# Patient Record
Sex: Female | Born: 1995 | Race: White | Hispanic: No | Marital: Single | Smoking: Never smoker
Health system: Southern US, Community
[De-identification: ages and names within clinical notes are randomized; demographics above are authoritative.]

## PROBLEM LIST (undated history)

## (undated) DIAGNOSIS — Z789 Other specified health status: Secondary | ICD-10-CM

## (undated) HISTORY — DX: Other specified health status: Z78.9

## (undated) HISTORY — PX: NO PAST SURGERIES: SHX2092

---

## 2020-09-23 ENCOUNTER — Ambulatory Visit (INDEPENDENT_AMBULATORY_CARE_PROVIDER_SITE_OTHER): Payer: 59 | Admitting: Medical-Surgical

## 2020-09-23 ENCOUNTER — Other Ambulatory Visit: Payer: Self-pay

## 2020-09-23 ENCOUNTER — Encounter: Payer: Self-pay | Admitting: Medical-Surgical

## 2020-09-23 VITALS — BP 148/83 | HR 100 | Temp 99.2°F | Ht 66.0 in | Wt 160.3 lb

## 2020-09-23 DIAGNOSIS — Z0000 Encounter for general adult medical examination without abnormal findings: Secondary | ICD-10-CM

## 2020-09-23 DIAGNOSIS — Z7689 Persons encountering health services in other specified circumstances: Secondary | ICD-10-CM

## 2020-09-23 DIAGNOSIS — Z020 Encounter for examination for admission to educational institution: Secondary | ICD-10-CM | POA: Diagnosis not present

## 2020-09-23 DIAGNOSIS — Z1159 Encounter for screening for other viral diseases: Secondary | ICD-10-CM

## 2020-09-23 DIAGNOSIS — Z114 Encounter for screening for human immunodeficiency virus [HIV]: Secondary | ICD-10-CM

## 2020-09-23 NOTE — Progress Notes (Signed)
New Patient Office Visit  Subjective:  Patient ID: Vanessa Hill, female    DOB: July 25, 1995  Age: 25 y.o. MRN: [id number]  CC:  Chief Complaint  Patient presents with  . Establish Care    HPI Vanessa Hill presents to establish care.   Dental care-up-to-date, no current concerns Eye exam-up-to-date, wears glasses Exercise-regularly does cardiovascular exercise as well as weight training Diet-no special diets, eats well-balanced but does occasionally enjoy fast food Pap smear-has never had 1, currently on her menstrual cycle so we will not do this today COVID vaccines-done  Past Medical History:  Diagnosis Date  . No pertinent past medical history     Past Surgical History:  Procedure Laterality Date  . NO PAST SURGERIES      Family History  Problem Relation Age of Onset  . Pancreatic cancer Maternal Grandmother     Social History   Socioeconomic History  . Marital status: Single    Spouse name: Not on file  . Number of children: Not on file  . Years of education: Not on file  . Highest education level: Not on file  Occupational History  . Not on file  Tobacco Use  . Smoking status: Never Smoker  . Smokeless tobacco: Never Used  Vaping Use  . Vaping Use: Never used  Substance and Sexual Activity  . Alcohol use: Yes    Comment: Rarely  . Drug use: Never  . Sexual activity: Not Currently    Birth control/protection: Condom  Other Topics Concern  . Not on file  Social History Narrative  . Not on file   Social Determinants of Health   Financial Resource Strain: Not on file  Food Insecurity: Not on file  Transportation Needs: Not on file  Physical Activity: Not on file  Stress: Not on file  Social Connections: Not on file  Intimate Partner Violence: Not on file    ROS Review of Systems  Constitutional: Negative for chills, fatigue, fever and unexpected weight change.  HENT: Negative for congestion, rhinorrhea, sinus pressure and sore  throat.   Respiratory: Negative for cough, chest tightness and shortness of breath.   Cardiovascular: Negative for chest pain, palpitations and leg swelling.  Gastrointestinal: Negative for abdominal pain, constipation, diarrhea, nausea and vomiting.  Endocrine: Negative for cold intolerance and heat intolerance.  Genitourinary: Negative for dysuria, frequency, urgency and vaginal discharge.  Skin: Negative for rash and wound.  Neurological: Negative for dizziness, light-headedness and headaches.  Hematological: Does not bruise/bleed easily.  Psychiatric/Behavioral: Negative for self-injury, sleep disturbance and suicidal ideas. The patient is not nervous/anxious.     Objective:   Today's Vitals: BP (!) 148/83   Pulse 100   Temp 99.2 F (37.3 C)   Ht '5\' 6"'  (1.676 m)   Wt 160 lb 4.8 oz (72.7 kg)   LMP 09/21/2020   SpO2 100%   BMI 25.87 kg/m   Physical Exam Constitutional:      General: She is not in acute distress.    Appearance: Normal appearance. She is not ill-appearing.  HENT:     Head: Normocephalic and atraumatic.     Right Ear: Tympanic membrane, ear canal and external ear normal. There is no impacted cerumen.     Left Ear: Tympanic membrane, ear canal and external ear normal. There is no impacted cerumen.  Eyes:     General:        Right eye: No discharge.        Left eye: No  discharge.     Extraocular Movements: Extraocular movements intact.     Conjunctiva/sclera: Conjunctivae normal.     Pupils: Pupils are equal, round, and reactive to light.  Neck:     Thyroid: No thyromegaly.     Vascular: No carotid bruit or JVD.     Trachea: Trachea normal.  Cardiovascular:     Rate and Rhythm: Normal rate and regular rhythm.     Pulses: Normal pulses.     Heart sounds: Normal heart sounds. No murmur heard. No friction rub. No gallop.   Pulmonary:     Effort: Pulmonary effort is normal. No respiratory distress.     Breath sounds: Normal breath sounds. No wheezing.   Abdominal:     General: Bowel sounds are normal. There is no distension.     Palpations: Abdomen is soft.     Tenderness: There is no abdominal tenderness. There is no guarding.  Musculoskeletal:        General: Normal range of motion.     Cervical back: Normal range of motion and neck supple.  Skin:    General: Skin is warm and dry.  Neurological:     Mental Status: She is alert and oriented to person, place, and time.     Cranial Nerves: No cranial nerve deficit.  Psychiatric:        Mood and Affect: Mood normal.        Behavior: Behavior normal.        Thought Content: Thought content normal.        Judgment: Judgment normal.     Assessment & Plan:   1. Encounter to establish care Reviewed available information and discussed healthcare concerns with patient.  2. School physical exam School exam forms provided by patient.  Screenings completed.  Labs ordered for hepatitis B, MMR, and varicella titers.  Obtaining an up-to-date QuantiFERON gold today. - Hepatitis B surface antibody,quantitative - QuantiFERON-TB Gold Plus - Measles/Mumps/Rubella Immunity - Varicella-Zoster Virus Antibody (Immunity Screen), ACIF ,Serum  3. Preventative health care Checking CBC with differential, CMP, and lipid panel today. - CBC with Differential/Platelet - COMPLETE METABOLIC PANEL WITH GFR - Lipid panel  No outpatient encounter medications on file as of 09/23/2020.   No facility-administered encounter medications on file as of 09/23/2020.    Follow-up: Return in about 1 year (around 09/23/2021) for annual physical exam or sooner if needed.   Clearnce Sorrel, DNP, APRN, FNP-BC [facility]

## 2020-09-24 ENCOUNTER — Encounter: Payer: 59 | Admitting: Medical-Surgical

## 2020-09-24 DIAGNOSIS — Z0000 Encounter for general adult medical examination without abnormal findings: Secondary | ICD-10-CM

## 2020-09-24 DIAGNOSIS — Z114 Encounter for screening for human immunodeficiency virus [HIV]: Secondary | ICD-10-CM

## 2020-09-24 DIAGNOSIS — Z1159 Encounter for screening for other viral diseases: Secondary | ICD-10-CM

## 2020-09-26 LAB — CBC WITH DIFFERENTIAL/PLATELET
Absolute Monocytes: 370 cells/uL (ref 200–950)
Basophils Absolute: 53 cells/uL (ref 0–200)
Basophils Relative: 0.8 %
Eosinophils Absolute: 119 cells/uL (ref 15–500)
Eosinophils Relative: 1.8 %
HCT: 42.4 % (ref 35.0–45.0)
Hemoglobin: 14.1 g/dL (ref 11.7–15.5)
Lymphs Abs: 1967 cells/uL (ref 850–3900)
MCH: 28.5 pg (ref 27.0–33.0)
MCHC: 33.3 g/dL (ref 32.0–36.0)
MCV: 85.7 fL (ref 80.0–100.0)
MPV: 9.4 fL (ref 7.5–12.5)
Monocytes Relative: 5.6 %
Neutro Abs: 4092 cells/uL (ref 1500–7800)
Neutrophils Relative %: 62 %
Platelets: 434 10*3/uL — ABNORMAL HIGH (ref 140–400)
RBC: 4.95 10*6/uL (ref 3.80–5.10)
RDW: 12.2 % (ref 11.0–15.0)
Total Lymphocyte: 29.8 %
WBC: 6.6 10*3/uL (ref 3.8–10.8)

## 2020-09-26 LAB — COMPLETE METABOLIC PANEL WITH GFR
AG Ratio: 1.9 (calc) (ref 1.0–2.5)
ALT: 9 U/L (ref 6–29)
AST: 13 U/L (ref 10–30)
Albumin: 5.1 g/dL (ref 3.6–5.1)
Alkaline phosphatase (APISO): 64 U/L (ref 31–125)
BUN/Creatinine Ratio: 7 (calc) (ref 6–22)
BUN: 6 mg/dL — ABNORMAL LOW (ref 7–25)
CO2: 25 mmol/L (ref 20–32)
Calcium: 9.8 mg/dL (ref 8.6–10.2)
Chloride: 104 mmol/L (ref 98–110)
Creat: 0.87 mg/dL (ref 0.50–1.10)
GFR, Est African American: 109 mL/min/{1.73_m2} (ref >=60)
GFR, Est Non African American: 94 mL/min/{1.73_m2} (ref >=60)
Globulin: 2.7 g/dL (calc) (ref 1.9–3.7)
Glucose, Bld: 79 mg/dL (ref 65–99)
Potassium: 4.7 mmol/L (ref 3.5–5.3)
Sodium: 138 mmol/L (ref 135–146)
Total Bilirubin: 0.3 mg/dL (ref 0.2–1.2)
Total Protein: 7.8 g/dL (ref 6.1–8.1)

## 2020-09-26 LAB — VARICELLA-ZOSTER VIRUS AB(IMMUNITY SCREEN),ACIF,SERUM: VARICELLA ZOSTER VIRUS AB (IMMUNITY SCR),ACIF SERUM: >=1:4 {titer}

## 2020-09-26 LAB — QUANTIFERON-TB GOLD PLUS
Mitogen-NIL: 8.5 IU/mL
NIL: 0.02 IU/mL
QuantiFERON-TB Gold Plus: NEGATIVE
TB1-NIL: 0 IU/mL
TB2-NIL: 0 IU/mL

## 2020-09-26 LAB — MEASLES/MUMPS/RUBELLA IMMUNITY
Mumps IgG: <9 AU/mL — ABNORMAL LOW
Rubella: 1.98 Index
Rubeola IgG: 55.2 AU/mL

## 2020-09-26 LAB — LIPID PANEL
Cholesterol: 183 mg/dL (ref <200)
HDL: 42 mg/dL — ABNORMAL LOW (ref >=50)
LDL Cholesterol (Calc): 120 mg/dL (calc) — ABNORMAL HIGH
Non-HDL Cholesterol (Calc): 141 mg/dL (calc) — ABNORMAL HIGH (ref <130)
Total CHOL/HDL Ratio: 4.4 (calc) (ref <5.0)
Triglycerides: 103 mg/dL (ref <150)

## 2020-09-26 LAB — HEPATITIS B SURFACE ANTIBODY, QUANTITATIVE: Hep B S AB Quant (Post): 6 m[IU]/mL — ABNORMAL LOW (ref >=10)

## 2020-09-29 ENCOUNTER — Encounter: Payer: Self-pay | Admitting: Medical-Surgical

## 2020-09-29 ENCOUNTER — Telehealth: Payer: Self-pay | Admitting: Medical-Surgical

## 2020-09-29 NOTE — Telephone Encounter (Signed)
Made 2 attempts to contact the patient. No answer. VM full.

## 2020-09-29 NOTE — Telephone Encounter (Signed)
Forms for her school physical have been placed in the file folder at the front for [facility] to pick up. They have not been faxed because the fax number is not on the forms. Please let her know she can come by at her convenience to get these. She also should schedule a nurse visit to get a booster of her Hepatitis B and MMR vaccines.   Clearnce Sorrel, DNP, APRN, FNP-BC [facility]

## 2020-10-02 ENCOUNTER — Encounter: Payer: Self-pay | Admitting: Medical-Surgical

## 2020-10-02 ENCOUNTER — Other Ambulatory Visit: Payer: Self-pay

## 2020-10-02 ENCOUNTER — Ambulatory Visit (INDEPENDENT_AMBULATORY_CARE_PROVIDER_SITE_OTHER): Payer: 59 | Admitting: Medical-Surgical

## 2020-10-02 VITALS — BP 114/74 | HR 79 | Temp 98.6°F | Ht 66.0 in | Wt 159.4 lb

## 2020-10-02 DIAGNOSIS — Z23 Encounter for immunization: Secondary | ICD-10-CM

## 2020-10-02 NOTE — Patient Instructions (Signed)
MMR Vaccine (Measles, Mumps, and Rubella): What You Need to Know 1. Why get vaccinated? MMR vaccine can prevent measles, mumps, and rubella.  MEASLES (M) causes fever, cough, runny nose, and red, watery eyes, commonly followed by a rash that covers the whole body. It can lead to seizures (often associated with fever), ear infections, diarrhea, and pneumonia. Rarely, measles can cause brain damage or death.  MUMPS (M) causes fever, headache, muscle aches, tiredness, loss of appetite, and swollen and tender salivary glands under the ears. It can lead to deafness, swelling of the brain and/or spinal cord covering, painful swelling of the testicles or ovaries, and, very rarely, death.  RUBELLA (R) causes fever, sore throat, rash, headache, and eye irritation. It can cause arthritis in up to half of teenage and adult women. If a person gets rubella while they are pregnant, they could have a miscarriage or the baby could be born with serious birth defects. Most people who are vaccinated with MMR will be protected for life. Vaccines and high rates of vaccination have made these diseases much less common in the [place]. 2. MMR vaccine Children need 2 doses of MMR vaccine, usually:  First dose at age 85 through 36 months  Second dose at age 67 through 70 years Infants who will be traveling outside the [place] when they are between 18 and 8 months of age should get a dose of MMR vaccine before travel. These children should still get 2 additional doses at the recommended ages for long-lasting protection. Older children, adolescents, and adults also need 1 or 2 doses of MMR vaccine if they are not already immune to measles, mumps, and rubella. Your health care provider can help you determine how many doses you need. A third dose of MMR might be recommended for certain people in mumps outbreak situations. MMR vaccine may be given at the same time as other vaccines. Children 12 months through 44 years  of age might receive MMR vaccine together with varicella vaccine in a single shot, known as MMRV. Your health care provider can give you more information. 3. Talk with your health care provider Tell your vaccination provider if the person getting the vaccine:  Has had an allergic reaction after a previous dose of MMR or MMRV vaccine, or has any severe, life-threatening allergies  Is pregnant or thinks they might be pregnant--pregnant people should not get MMR vaccine  Has a weakened immune system, or has a parent, brother, or sister with a history of hereditary or congenital immune system problems  Has ever had a condition that makes him or her bruise or bleed easily  Has recently had a blood transfusion or received other blood products  Has tuberculosis  Has gotten any other vaccines in the past 4 weeks In some cases, your health care provider may decide to postpone MMR vaccination until a future visit. People with minor illnesses, such as a cold, may be vaccinated. People who are moderately or severely ill should usually wait until they recover before getting MMR vaccine. Your health care provider can give you more information. 4. Risks of a vaccine reaction  Sore arm from the injection or redness where the shot is given, fever, and a mild rash can happen after MMR vaccination.  Swelling of the glands in the cheeks or neck or temporary pain and stiffness in the joints (mostly in teenage or adult women) sometimes occur after MMR vaccination.  More serious reactions happen rarely. These can include seizures (often  associated with fever) or temporary low platelet count that can cause unusual bleeding or bruising.  In people with serious immune system problems, this vaccine may cause an infection which may be life-threatening. People with serious immune system problems should not get MMR vaccine. People sometimes faint after medical procedures, including vaccination. Tell your provider if  you feel dizzy or have vision changes or ringing in the ears. As with any medicine, there is a very remote chance of a vaccine causing a severe allergic reaction, other serious injury, or death. 5. What if there is a serious problem? An allergic reaction could occur after the vaccinated person leaves the clinic. If you see signs of a severe allergic reaction (hives, swelling of the face and throat, difficulty breathing, a fast heartbeat, dizziness, or weakness), call 9-1-1 and get the person to the nearest hospital. For other signs that concern you, call your health care provider. Adverse reactions should be reported to the Vaccine Adverse Event Reporting System (VAERS). Your health care provider will usually file this report, or you can do it yourself. Visit the VAERS website at www.vaers.SamedayNews.es or call 4371280182. VAERS is only for reporting reactions, and VAERS staff members do not give medical advice. 6. The National Vaccine Injury Compensation Program The [employer] Vaccine Injury Compensation Program (VICP) is a federal program that was created to compensate people who may have been injured by certain vaccines. Claims regarding alleged injury or death due to vaccination have a time limit for filing, which may be as short as two years. Visit the VICP website at GoldCloset.com.ee or call (863) 666-0742 to learn about the program and about filing a claim. 7. How can I learn more?  Ask your health care provider.  Call your local or state health department.  Visit the website of the Food and Drug Administration (FDA) for vaccine package inserts and additional information at TraderRating.uy.  Contact the Centers for Disease Control and Prevention (CDC): ? Call 343-460-8848 (1-800-CDC-INFO) or ? Visit CDC's website at http://hunter.com/. Vaccine Information Statement MMR Vaccine (12/27/2019) This information is not intended to replace advice given  to you by your health care provider. Make sure you discuss any questions you have with your health care provider. Document Revised: 02/13/2020 Document Reviewed: 02/13/2020 Elsevier Patient Education  [place]. Hepatitis B Vaccine: What You Need to Know 1. Why get vaccinated? Hepatitis B vaccine can prevent hepatitis B. Hepatitis B is a liver disease that can cause mild illness lasting a few weeks, or it can lead to a serious, lifelong illness.  Acute hepatitis B infection is a short-term illness that can lead to fever, fatigue, loss of appetite, nausea, vomiting, jaundice (yellow skin or eyes, dark urine, clay-colored bowel movements), and pain in the muscles, joints, and stomach.  Chronic hepatitis B infection is a long-term illness that occurs when the hepatitis B virus remains in a person's body. Most people who go on to develop chronic hepatitis B do not have symptoms, but it is still very serious and can lead to liver damage (cirrhosis), liver cancer, and death. Chronically infected people can spread hepatitis B virus to others, even if they do not feel or look sick themselves. Hepatitis B is spread when blood, semen, or other body fluid infected with the hepatitis B virus enters the body of a person who is not infected. People can become infected through:  Birth (if a pregnant person has hepatitis B, their baby can become infected)  Sharing items such as razors or toothbrushes  with an infected person  Contact with the blood or open sores of an infected person  Sex with an infected partner  Sharing needles, syringes, or other drug-injection equipment  Exposure to blood from needlesticks or other sharp instruments Most people who are vaccinated with hepatitis B vaccine are immune for life. 2. Hepatitis B vaccine Hepatitis B vaccine is usually given as 2, 3, or 4 shots. Infants should get their first dose of hepatitis B vaccine at birth and will usually complete the series at  3-69 months of age. The birth dose of hepatitis B vaccine is an important part of preventing long-term illness in infants and the spread of hepatitis B in the [place]. Children and adolescents younger than 70 years of age who have not yet gotten the vaccine should be vaccinated. Adults who were not vaccinated previously and want to be protected against hepatitis B can also get the vaccine. Hepatitis B vaccine is also recommended for the following people:  People whose sex partners have hepatitis B  Sexually active persons who are not in a long-term, monogamous relationship  People seeking evaluation or treatment for a sexually transmitted disease  Victims of sexual assault or abuse  Men who have sexual contact with other men  People who share needles, syringes, or other drug-injection equipment  People who live with someone infected with the hepatitis B virus  Health care and public safety workers at risk for exposure to blood or body fluids  Residents and staff of facilities for developmentally disabled people  People living in jail or prison  Travelers to regions with increased rates of hepatitis B  People with chronic liver disease, kidney disease on dialysis, HIV infection, infection with hepatitis C, or diabetes Hepatitis B vaccine may be given as a stand-alone vaccine, or as part of a combination vaccine (a type of vaccine that combines more than one vaccine together into one shot). Hepatitis B vaccine may be given at the same time as other vaccines. 3. Talk with your health care provider Tell your vaccination provider if the person getting the vaccine:  Has had an allergic reaction after a previous dose of hepatitis B vaccine, or has any severe, life-threatening allergies In some cases, your health care provider may decide to postpone hepatitis B vaccination until a future visit. Pregnant or breastfeeding people should be vaccinated if they are at risk for getting  hepatitis B. Pregnancy or breastfeeding are not reasons to avoid hepatitis B vaccination. People with minor illnesses, such as a cold, may be vaccinated. People who are moderately or severely ill should usually wait until they recover before getting hepatitis B vaccine. Your health care provider can give you more information. 4. Risks of a vaccine reaction  Soreness where the shot is given or fever can happen after hepatitis B vaccination. People sometimes faint after medical procedures, including vaccination. Tell your provider if you feel dizzy or have vision changes or ringing in the ears. As with any medicine, there is a very remote chance of a vaccine causing a severe allergic reaction, other serious injury, or death. 5. What if there is a serious problem? An allergic reaction could occur after the vaccinated person leaves the clinic. If you see signs of a severe allergic reaction (hives, swelling of the face and throat, difficulty breathing, a fast heartbeat, dizziness, or weakness), call 9-1-1 and get the person to the nearest hospital. For other signs that concern you, call your health care provider. Adverse reactions should be  reported to the Vaccine Adverse Event Reporting System (VAERS). Your health care provider will usually file this report, or you can do it yourself. Visit the VAERS website at www.vaers.SamedayNews.es or call 763-516-9068.VAERS is only for reporting reactions, and VAERS staff members do not give medical advice. 6. The National Vaccine Injury Compensation Program The [employer] Vaccine Injury Compensation Program (VICP) is a federal program that was created to compensate people who may have been injured by certain vaccines. Claims regarding alleged injury or death due to vaccination have a time limit for filing, which may be as short as two years. Visit the VICP website at GoldCloset.com.ee or call 3377356607 to learn about the program and about filing a  claim. 7. How can I learn more?  Ask your health care provider.  Call your local or state health department.  Visit the website of the Food and Drug Administration (FDA) for vaccine package inserts and additional information at TraderRating.uy.  Contact the Centers for Disease Control and Prevention (CDC): ? Call 850-043-6664 (1-800-CDC-INFO) or ? Visit CDC's website at http://hunter.com/. Vaccine Information Statement Hepatitis B Vaccine (03/06/2020) This information is not intended to replace advice given to you by your health care provider. Make sure you discuss any questions you have with your health care provider. Document Revised: 03/27/2020 Document Reviewed: 03/27/2020 Elsevier Patient Education  2021 [employer].

## 2020-10-02 NOTE — Progress Notes (Signed)
  Subjective:    CC: vaccine boosters  HPI: Pleasant 25 year old female presenting today to get boosters for Hepatitis B and MMR. She is looking to go to school soon so titers were drawn for verification of immunity. Her hepatitis B and Mumps showed no immunity.   She is showing due for the HPV vaccines as well as a pap smear. Has never had a pap smear. Not currently sexually active.   I reviewed the past medical history, family history, social history, surgical history, and allergies today and no changes were needed.  Please see the problem list section below in epic for further details.  Past Medical History: Past Medical History:  Diagnosis Date  . No pertinent past medical history    Past Surgical History: Past Surgical History:  Procedure Laterality Date  . NO PAST SURGERIES     Social History: Social History   Socioeconomic History  . Marital status: Single    Spouse name: Not on file  . Number of children: Not on file  . Years of education: Not on file  . Highest education level: Not on file  Occupational History  . Not on file  Tobacco Use  . Smoking status: Never Smoker  . Smokeless tobacco: Never Used  Vaping Use  . Vaping Use: Never used  Substance and Sexual Activity  . Alcohol use: Yes    Comment: Rarely  . Drug use: Never  . Sexual activity: Not Currently    Birth control/protection: Condom  Other Topics Concern  . Not on file  Social History Narrative  . Not on file   Social Determinants of Health   Financial Resource Strain: Not on file  Food Insecurity: Not on file  Transportation Needs: Not on file  Physical Activity: Not on file  Stress: Not on file  Social Connections: Not on file   Family History: Family History  Problem Relation Age of Onset  . Pancreatic cancer Maternal Grandmother    Allergies: No Known Allergies Medications: See med rec.  Review of Systems: See HPI for pertinent positives and negatives.   Objective:     General: Well Developed, well nourished, and in no acute distress.  Neuro: Alert and oriented x3.  HEENT: Normocephalic, atraumatic.  Skin: Warm and dry. Cardiac: Regular rate and rhythm.  Respiratory: Not using accessory muscles, speaking in full sentences.   Impression and Recommendations:    1. Need for hepatitis B vaccination Hepatitis B vaccine given in office.  - Hepatitis B vaccine adult IM  2. Need for MMR vaccine MMR given in office.  - MMR vaccine subcutaneous  Declined HPV vaccines. Declined pap smear today. Advised to return at her convenience to complete this. If she would prefer to see OB/GYN, ok to put in referral.   Return if symptoms worsen or fail to improve. ___________________________________________ Clearnce Sorrel, DNP, APRN, FNP-BC [facility]

## 2020-10-05 ENCOUNTER — Encounter (HOSPITAL_BASED_OUTPATIENT_CLINIC_OR_DEPARTMENT_OTHER): Payer: Self-pay

## 2020-10-05 ENCOUNTER — Emergency Department (HOSPITAL_BASED_OUTPATIENT_CLINIC_OR_DEPARTMENT_OTHER)
Admission: EM | Admit: 2020-10-05 | Discharge: 2020-10-05 | Disposition: A | Discharge status: Home / Self Care | Payer: 59 | Attending: Emergency Medicine | Admitting: Emergency Medicine

## 2020-10-05 ENCOUNTER — Emergency Department (HOSPITAL_BASED_OUTPATIENT_CLINIC_OR_DEPARTMENT_OTHER): Payer: 59

## 2020-10-05 ENCOUNTER — Other Ambulatory Visit: Payer: Self-pay

## 2020-10-05 DIAGNOSIS — R110 Nausea: Secondary | ICD-10-CM | POA: Diagnosis not present

## 2020-10-05 DIAGNOSIS — M545 Low back pain, unspecified: Secondary | ICD-10-CM | POA: Insufficient documentation

## 2020-10-05 DIAGNOSIS — R1031 Right lower quadrant pain: Secondary | ICD-10-CM | POA: Diagnosis not present

## 2020-10-05 DIAGNOSIS — R109 Unspecified abdominal pain: Secondary | ICD-10-CM

## 2020-10-05 LAB — COMPREHENSIVE METABOLIC PANEL
ALT: 18 U/L (ref 0–44)
AST: 18 U/L (ref 15–41)
Albumin: 4.9 g/dL (ref 3.5–5.0)
Alkaline Phosphatase: 62 U/L (ref 38–126)
Anion gap: 11 (ref 5–15)
BUN: 13 mg/dL (ref 6–20)
CO2: 24 mmol/L (ref 22–32)
Calcium: 10.2 mg/dL (ref 8.9–10.3)
Chloride: 104 mmol/L (ref 98–111)
Creatinine, Ser: 1.21 mg/dL — ABNORMAL HIGH (ref 0.44–1.00)
GFR, Estimated: >60 mL/min (ref >60)
Glucose, Bld: 108 mg/dL — ABNORMAL HIGH (ref 70–99)
Potassium: 4.2 mmol/L (ref 3.5–5.1)
Sodium: 139 mmol/L (ref 135–145)
Total Bilirubin: 0.2 mg/dL — ABNORMAL LOW (ref 0.3–1.2)
Total Protein: 9 g/dL — ABNORMAL HIGH (ref 6.5–8.1)

## 2020-10-05 LAB — PREGNANCY, URINE: Preg Test, Ur: NEGATIVE

## 2020-10-05 LAB — CBC WITH DIFFERENTIAL/PLATELET
Abs Immature Granulocytes: 0.03 10*3/uL (ref 0.00–0.07)
Basophils Absolute: 0.1 10*3/uL (ref 0.0–0.1)
Basophils Relative: 0 %
Eosinophils Absolute: 0.2 10*3/uL (ref 0.0–0.5)
Eosinophils Relative: 1 %
HCT: 42.9 % (ref 36.0–46.0)
Hemoglobin: 14.7 g/dL (ref 12.0–15.0)
Immature Granulocytes: 0 %
Lymphocytes Relative: 18 %
Lymphs Abs: 2.5 10*3/uL (ref 0.7–4.0)
MCH: 28.8 pg (ref 26.0–34.0)
MCHC: 34.3 g/dL (ref 30.0–36.0)
MCV: 84.1 fL (ref 80.0–100.0)
Monocytes Absolute: 1 10*3/uL (ref 0.1–1.0)
Monocytes Relative: 8 %
Neutro Abs: 9.8 10*3/uL — ABNORMAL HIGH (ref 1.7–7.7)
Neutrophils Relative %: 73 %
Platelets: 413 10*3/uL — ABNORMAL HIGH (ref 150–400)
RBC: 5.1 MIL/uL (ref 3.87–5.11)
RDW: 11.7 % (ref 11.5–15.5)
WBC: 13.6 10*3/uL — ABNORMAL HIGH (ref 4.0–10.5)
nRBC: 0 % (ref 0.0–0.2)

## 2020-10-05 LAB — URINALYSIS, ROUTINE W REFLEX MICROSCOPIC
Bilirubin Urine: NEGATIVE
Glucose, UA: NEGATIVE mg/dL
Hgb urine dipstick: NEGATIVE
Ketones, ur: NEGATIVE mg/dL
Leukocytes,Ua: NEGATIVE
Nitrite: NEGATIVE
Protein, ur: NEGATIVE mg/dL
Specific Gravity, Urine: 1.015 (ref 1.005–1.030)
pH: 5.5 (ref 5.0–8.0)

## 2020-10-05 LAB — LIPASE, BLOOD: Lipase: 41 U/L (ref 11–51)

## 2020-10-05 MED ORDER — ONDANSETRON HCL 4 MG/2ML IJ SOLN: 4.0000 mg | Freq: Once | INTRAMUSCULAR | Status: DC

## 2020-10-05 MED ORDER — SODIUM CHLORIDE 0.9 % IV BOLUS
1000.0000 mL | Freq: Once | INTRAVENOUS | Status: AC
  Administered 2020-10-05: 1000 mL via INTRAVENOUS

## 2020-10-05 MED ORDER — KETOROLAC TROMETHAMINE 30 MG/ML IJ SOLN
30.0000 mg | Freq: Once | INTRAMUSCULAR | Status: AC
  Administered 2020-10-05: 30 mg via INTRAVENOUS
  Filled 2020-10-05: qty 1

## 2020-10-05 NOTE — ED Notes (Signed)
Pt took Zofran at home, does not need IV at this time. MD aware

## 2020-10-05 NOTE — ED Provider Notes (Signed)
[facility] EMERGENCY DEPARTMENT Provider Note   CSN: [id number] Arrival date & time: 10/05/20  0539     History Chief Complaint  Patient presents with  . Flank Pain    Vanessa Hill is a 25 y.o. female.  She is here with a complaint of right low back pain and right lower quadrant pain associate with some nausea that started yesterday worsened at 4 AM today.  No trauma.  Felt hot but no recorded fever.  No other illness symptoms.  No prior history of same.  The history is provided by the patient.  Flank Pain This is a new problem. The current episode started 3 to 5 hours ago. The problem occurs constantly. The problem has not changed since onset.Associated symptoms include abdominal pain. Pertinent negatives include no chest pain, no headaches and no shortness of breath. Nothing aggravates the symptoms. Nothing relieves the symptoms. She has tried nothing for the symptoms. The treatment provided no relief.       Past Medical History:  Diagnosis Date  . No pertinent past medical history     There are no problems to display for this patient.   Past Surgical History:  Procedure Laterality Date  . NO PAST SURGERIES       OB History   No obstetric history on file.     Family History  Problem Relation Age of Onset  . Pancreatic cancer Maternal Grandmother     Social History   Tobacco Use  . Smoking status: Never Smoker  . Smokeless tobacco: Never Used  Vaping Use  . Vaping Use: Never used  Substance Use Topics  . Alcohol use: Yes    Comment: Rarely  . Drug use: Never    Home Medications Prior to Admission medications   Not on File    Allergies    Patient has no known allergies.  Review of Systems   Review of Systems  Constitutional: Negative for fever.  HENT: Negative for sore throat.   Eyes: Negative for visual disturbance.  Respiratory: Negative for shortness of breath.   Cardiovascular: Negative for chest pain.  Gastrointestinal:  Positive for abdominal pain and nausea. Negative for constipation, diarrhea and vomiting.  Genitourinary: Positive for flank pain. Negative for dysuria, vaginal bleeding and vaginal discharge.  Musculoskeletal: Positive for back pain.  Skin: Negative for rash.  Neurological: Negative for headaches.    Physical Exam Updated Vital Signs BP (!) 136/103 (BP Location: Right Arm)   Pulse (!) 102   Temp 98.7 F (37.1 C) (Oral)   Resp 16   Ht 5\' 6"  (1.676 m)   Wt 72.1 kg   LMP 09/21/2020   SpO2 100%   BMI 25.66 kg/m   Physical Exam Vitals and nursing note reviewed.  Constitutional:      General: She is not in acute distress.    Appearance: Normal appearance. She is well-developed.  HENT:     Head: Normocephalic and atraumatic.  Eyes:     Conjunctiva/sclera: Conjunctivae normal.  Cardiovascular:     Rate and Rhythm: Normal rate and regular rhythm.     Heart sounds: No murmur heard.   Pulmonary:     Effort: Pulmonary effort is normal. No respiratory distress.     Breath sounds: Normal breath sounds.  Abdominal:     Palpations: Abdomen is soft.     Tenderness: There is no abdominal tenderness.  Musculoskeletal:        General: No deformity or signs of injury. Normal  range of motion.     Cervical back: Neck supple.  Skin:    General: Skin is warm and dry.  Neurological:     General: No focal deficit present.     Mental Status: She is alert.     ED Results / Procedures / Treatments   Labs (all labs ordered are listed, but only abnormal results are displayed) Labs Reviewed  COMPREHENSIVE METABOLIC PANEL - Abnormal; Notable for the following components:      Result Value   Glucose, Bld 108 (*)    Creatinine, Ser 1.21 (*)    Total Protein 9.0 (*)    Total Bilirubin 0.2 (*)    All other components within normal limits  CBC WITH DIFFERENTIAL/PLATELET - Abnormal; Notable for the following components:   WBC 13.6 (*)    Platelets 413 (*)    Neutro Abs 9.8 (*)    All  other components within normal limits  URINALYSIS, ROUTINE W REFLEX MICROSCOPIC - Abnormal; Notable for the following components:   APPearance HAZY (*)    All other components within normal limits  LIPASE, BLOOD  PREGNANCY, URINE    EKG None  Radiology CT Renal Stone Study  Result Date: 10/05/2020 CLINICAL DATA:  Acute right flank pain. EXAM: CT ABDOMEN AND PELVIS WITHOUT CONTRAST TECHNIQUE: Multidetector CT imaging of the abdomen and pelvis was performed following the standard protocol without IV contrast. COMPARISON:  None. FINDINGS: Lower chest: No acute abnormality. Hepatobiliary: No focal liver abnormality is seen. No gallstones, gallbladder wall thickening, or biliary dilatation. Pancreas: Unremarkable. No pancreatic ductal dilatation or surrounding inflammatory changes. Spleen: Normal in size without focal abnormality. Adrenals/Urinary Tract: Adrenal glands are unremarkable. Kidneys are normal, without renal calculi, focal lesion, or hydronephrosis. Bladder is unremarkable. Stomach/Bowel: Stomach is within normal limits. Appendix appears normal. No evidence of bowel wall thickening, distention, or inflammatory changes. Vascular/Lymphatic: No significant vascular findings are present. No enlarged abdominal or pelvic lymph nodes. Reproductive: Uterus and bilateral adnexa are unremarkable. Other: No abdominal wall hernia or abnormality. No abdominopelvic ascites. Musculoskeletal: No acute or significant osseous findings. IMPRESSION: No abnormality seen in the abdomen or pelvis. Electronically Signed   By: Lupita Raider M.D.   On: 10/05/2020 09:58    Procedures Procedures   Medications Ordered in ED Medications  sodium chloride 0.9 % bolus 1,000 mL (has no administration in time range)  ondansetron (ZOFRAN) injection 4 mg (has no administration in time range)  ketorolac (TORADOL) 30 MG/ML injection 30 mg (has no administration in time range)    ED Course  I have reviewed the triage  vital signs and the nursing notes.  Pertinent labs & imaging results that were available during my care of the patient were reviewed by me and considered in my medical decision making (see chart for details).  Clinical Course as of 10/05/20 1829  Mon Oct 05, 2020  1022 No clear explanation for patient's symptoms with current work-up.  CT does not show any obvious kidney stone and no evidence of pyelonephritis.  Reviewed with patient and mother.  Offered pelvic ultrasound and further work-up which they declined at this time.  She will monitor her symptoms at home and return if any worsening [MB]    Clinical Course User Index [MB] Terrilee Files, MD   MDM Rules/Calculators/A&P                         This patient complains of lower abdominal pain; this  involves an extensive number of treatment Options and is a complaint that carries with it a high risk of complications and Morbidity. The differential includes appendicitis, colitis, peptic ulcer disease, ovarian cyst, ovarian torsion, UTI, colitis, constipation  I ordered, reviewed and interpreted labs, which included CBC with mildly elevated white count, normal hemoglobin, chemistries fairly normal other than elevated glucose and creatinine, normal LFTs, urinalysis without signs of infection, pregnancy test negative I ordered medication IV fluids and Toradol with improvement in her symptoms I ordered imaging studies which included CT abdomen and pelvis without contrast and I independently    visualized and interpreted imaging which showed without acute findings Additional history obtained from patient's mother Previous records obtained and reviewed in epic including PCP visit  After the interventions stated above, I reevaluated the patient and found patient to have a soft benign abdominal exam.  Reviewed work-up with her.  Offered pelvic exam and pelvic ultrasound for further work-up.  She declines this work-up at current time.  Recommended  symptomatic treatment and return instructions discussed   Final Clinical Impression(s) / ED Diagnoses Final diagnoses:  Right flank pain    Rx / DC Orders ED Discharge Orders    None       Terrilee Files, MD 10/05/20 [id number]

## 2020-10-05 NOTE — ED Notes (Signed)
ED Provider at bedside. 

## 2020-10-05 NOTE — ED Triage Notes (Signed)
Pt arrives with c/o lower back pain radiating more to right side and nausea. Pt had Zofran at home and took 4 mg PTA.

## 2020-10-05 NOTE — Discharge Instructions (Addendum)
You were seen in the emergency department for evaluation of right-sided flank and abdominal pain.  You had blood work urinalysis and a CAT scan of your abdomen and pelvis that did not show an obvious explanation for your symptoms.  Please monitor for any fever or worsening pain.  Follow-up with your doctor.  Return to the emergency department for any worsening or concerning symptoms

## 2020-10-27 ENCOUNTER — Encounter: Payer: Self-pay | Admitting: Medical-Surgical

## 2020-11-02 ENCOUNTER — Other Ambulatory Visit: Payer: Self-pay

## 2020-11-02 ENCOUNTER — Ambulatory Visit (INDEPENDENT_AMBULATORY_CARE_PROVIDER_SITE_OTHER): Payer: 59 | Admitting: Medical-Surgical

## 2020-11-02 VITALS — BP 137/83 | HR 86 | Resp 20 | Ht 66.0 in | Wt 159.0 lb

## 2020-11-02 DIAGNOSIS — Z23 Encounter for immunization: Secondary | ICD-10-CM

## 2020-11-02 MED ORDER — HEPATITIS B VAC RECOMBINANT 10 MCG/ML IJ SUSP: 1.0000 mL | Freq: Once | INTRAMUSCULAR | Status: DC

## 2020-11-02 NOTE — Progress Notes (Signed)
Established Patient Office Visit  Subjective:  Patient ID: Vanessa Hill, female    DOB: [birth year]  Age: 25 y.o. MRN: [id number]  CC:  Chief Complaint  Patient presents with   Immunizations    HPI Holland Kotter presents for vaccines. Hep B vaccine given in left deltoid. MMR vaccine given subcutaneous in left arm.  Past Medical History:  Diagnosis Date   No pertinent past medical history     Past Surgical History:  Procedure Laterality Date   NO PAST SURGERIES      Family History  Problem Relation Age of Onset   Pancreatic cancer Maternal Grandmother     Social History   Socioeconomic History   Marital status: Single    Spouse name: Not on file   Number of children: Not on file   Years of education: Not on file   Highest education level: Not on file  Occupational History   Not on file  Tobacco Use   Smoking status: Never   Smokeless tobacco: Never  Vaping Use   Vaping Use: Never used  Substance and Sexual Activity   Alcohol use: Yes    Comment: Rarely   Drug use: Never   Sexual activity: Not Currently    Birth control/protection: Condom  Other Topics Concern   Not on file  Social History Narrative   Not on file   Social Determinants of Health   Financial Resource Strain: Not on file  Food Insecurity: Not on file  Transportation Needs: Not on file  Physical Activity: Not on file  Stress: Not on file  Social Connections: Not on file  Intimate Partner Violence: Not on file    No outpatient medications prior to visit.   No facility-administered medications prior to visit.    No Known Allergies  ROS Review of Systems    Objective:    Physical Exam  BP 137/83 (BP Location: Left Arm, Patient Position: Sitting, Cuff Size: Normal)   Pulse 86   Resp 20   Ht '5\' 6"'  (1.676 m)   Wt 159 lb (72.1 kg)   SpO2 100%   BMI 25.66 kg/m  Wt Readings from Last 3 Encounters:  11/02/20 159 lb (72.1 kg)  10/05/20 159 lb (72.1 kg)   10/02/20 159 lb 6.4 oz (72.3 kg)     Health Maintenance Due  Topic Date Due   HPV VACCINES (1 - 2-dose series) Never done   HIV Screening  Never done   Hepatitis C Screening  Never done   PAP-Cervical Cytology Screening  Never done   PAP SMEAR-Modifier  Never done   COVID-19 Vaccine (3 - Booster for Pfizer series) 07/02/2020       Topic Date Due   HPV VACCINES (1 - 2-dose series) Never done    No results found for: TSH Lab Results  Component Value Date   WBC 13.6 (H) 10/05/2020   HGB 14.7 10/05/2020   HCT 42.9 10/05/2020   MCV 84.1 10/05/2020   PLT 413 (H) 10/05/2020   Lab Results  Component Value Date   NA 139 10/05/2020   K 4.2 10/05/2020   CO2 24 10/05/2020   GLUCOSE 108 (H) 10/05/2020   BUN 13 10/05/2020   CREATININE 1.21 (H) 10/05/2020   BILITOT 0.2 (L) 10/05/2020   ALKPHOS 62 10/05/2020   AST 18 10/05/2020   ALT 18 10/05/2020   PROT 9.0 (H) 10/05/2020   ALBUMIN 4.9 10/05/2020   CALCIUM 10.2 10/05/2020   ANIONGAP 11 10/05/2020  Lab Results  Component Value Date   CHOL 183 09/23/2020   Lab Results  Component Value Date   HDL 42 (L) 09/23/2020   Lab Results  Component Value Date   LDLCALC 120 (H) 09/23/2020   Lab Results  Component Value Date   TRIG 103 09/23/2020   Lab Results  Component Value Date   CHOLHDL 4.4 09/23/2020   No results found for: HGBA1C    Assessment & Plan:  Hep B vaccine given in left deltoid. MMR vaccine given subcutaneous in left arm. Problem List Items Addressed This Visit   None Visit Diagnoses     Need for MMR vaccine    -  Primary   Relevant Orders   MMR vaccine subcutaneous (Completed)   Need for hepatitis B vaccination       Relevant Orders   Hepatitis B vaccine adult IM (Completed)       Meds ordered this encounter  Medications   DISCONTD: hepatitis b vaccine for adults (RECOMBIVAX-HB) injection 10 mcg    Follow-up: No follow-ups on file.    Ninfa Meeker, CMA

## 2021-09-08 ENCOUNTER — Other Ambulatory Visit: Payer: Self-pay | Admitting: Medical-Surgical

## 2021-09-08 DIAGNOSIS — Z111 Encounter for screening for respiratory tuberculosis: Secondary | ICD-10-CM

## 2021-09-09 DIAGNOSIS — Z111 Encounter for screening for respiratory tuberculosis: Secondary | ICD-10-CM | POA: Diagnosis not present

## 2021-09-12 LAB — QUANTIFERON-TB GOLD PLUS
Mitogen-NIL: >10 IU/mL
NIL: 0.03 IU/mL
QuantiFERON-TB Gold Plus: NEGATIVE
TB1-NIL: 0 IU/mL
TB2-NIL: 0 IU/mL

## 2022-04-27 IMAGING — CT CT RENAL STONE PROTOCOL
2 of 4 series · 16 of 46 positions shown, 18 images · non-contrast
Comparison: None.

CLINICAL DATA: Acute right flank pain.

EXAM:
CT ABDOMEN AND PELVIS WITHOUT CONTRAST
TECHNIQUE: Multidetector CT imaging of the abdomen and pelvis was performed
following the standard protocol without IV contrast.

[Series 2: axial st · axial · 0.92mm/px · z∈[-471,-31]mm · 13 of 98 slices shown, 15 images]
[im 5/98  soft-tissue]
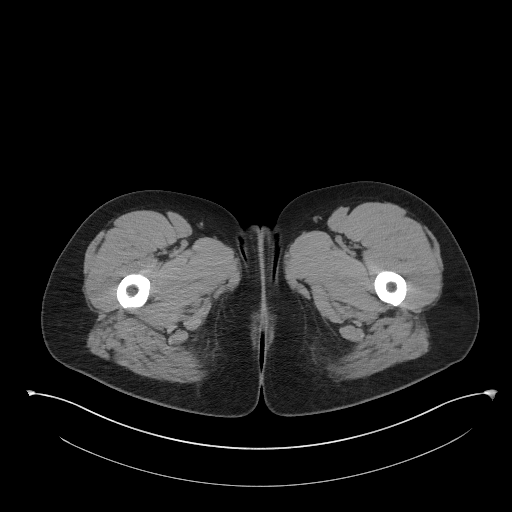
[im 5/98  bone]
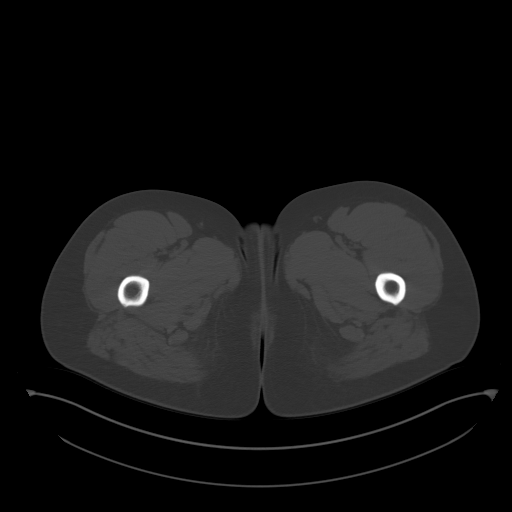
[im 13/98  soft-tissue]
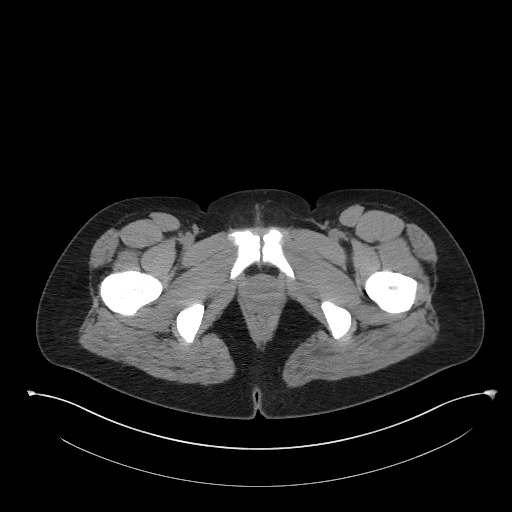
[im 21/98  soft-tissue]
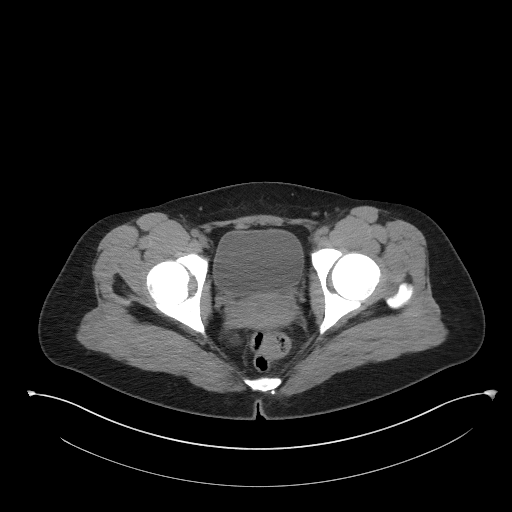
[im 29/98  soft-tissue]
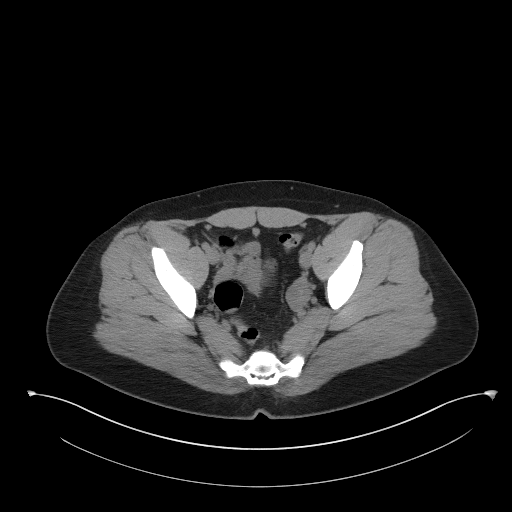
[im 33/98  soft-tissue]
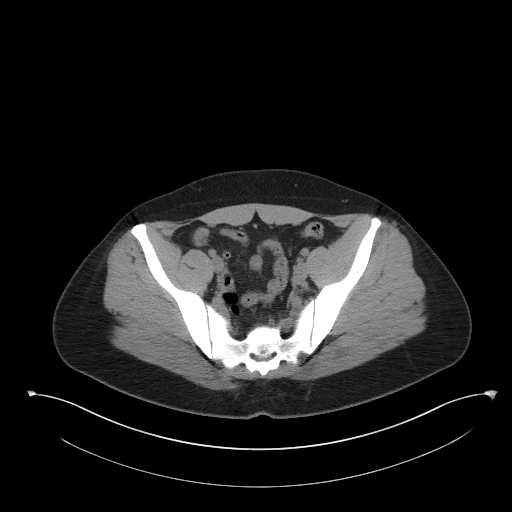
[im 41/98  soft-tissue]
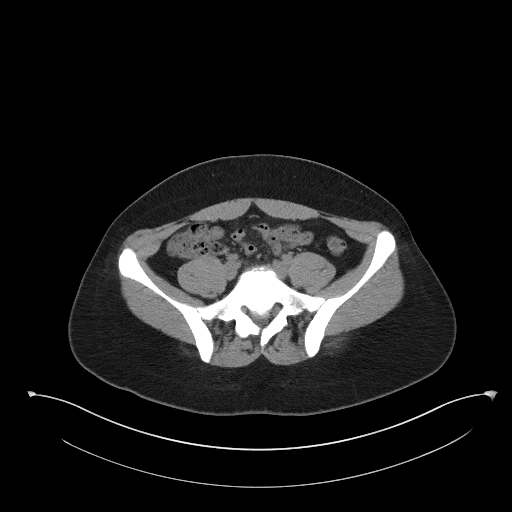
[im 49/98  soft-tissue]
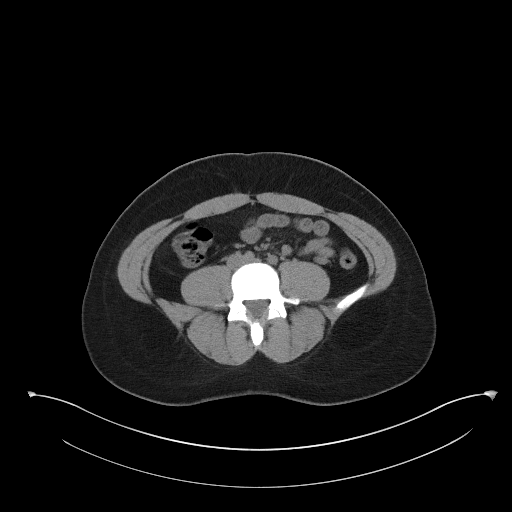
[im 57/98  soft-tissue]
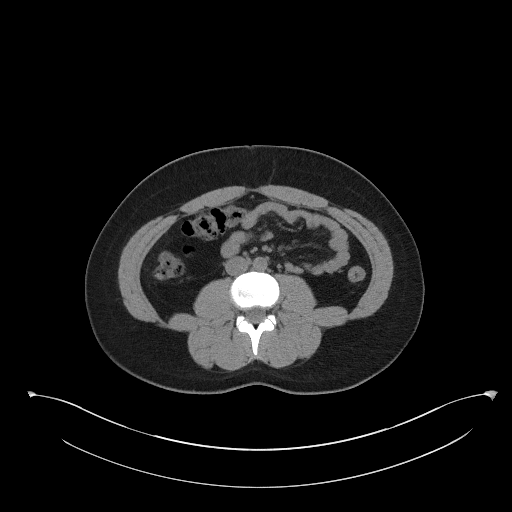
[im 65/98  soft-tissue]
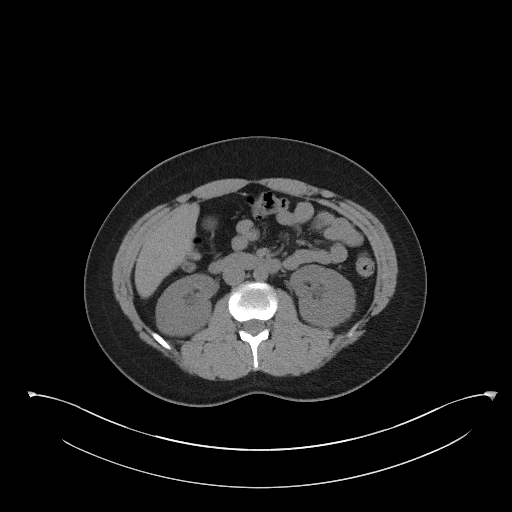
[im 65/98  bone]
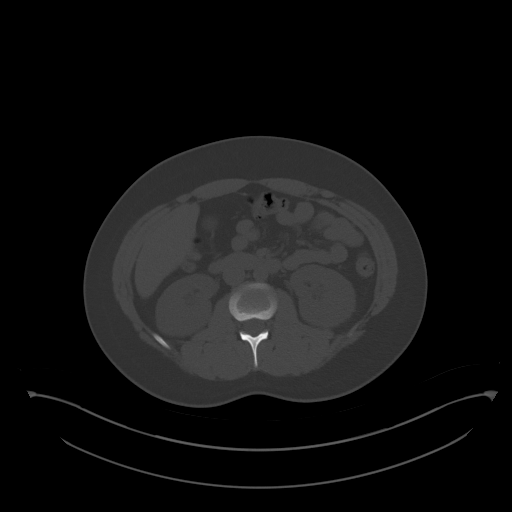
[im 69/98  soft-tissue]
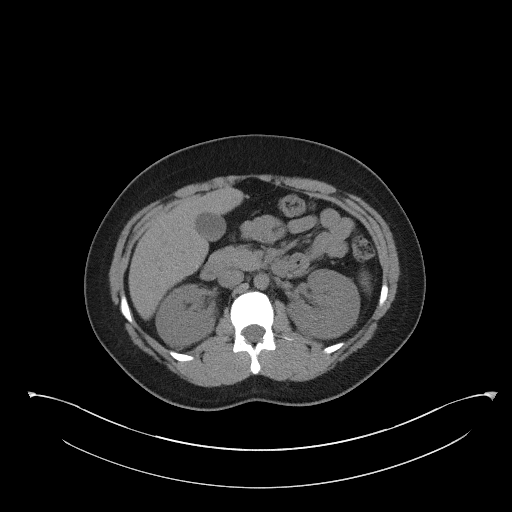
[im 77/98  soft-tissue]
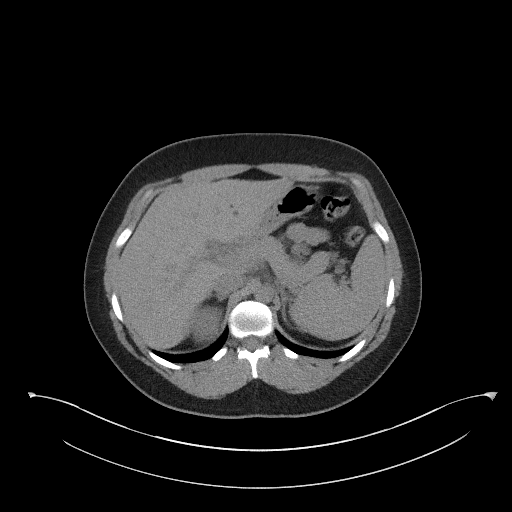
[im 85/98  soft-tissue]
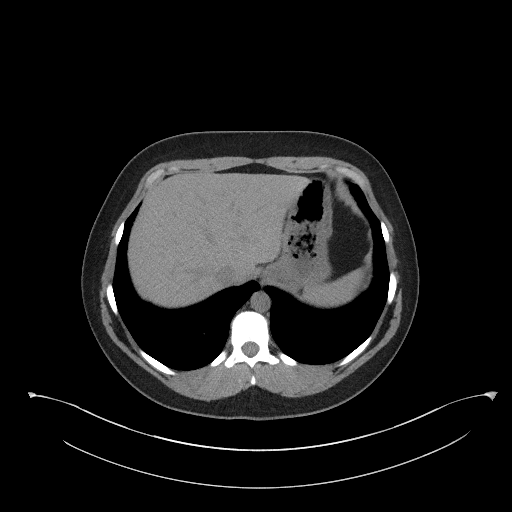
[im 93/98  soft-tissue]
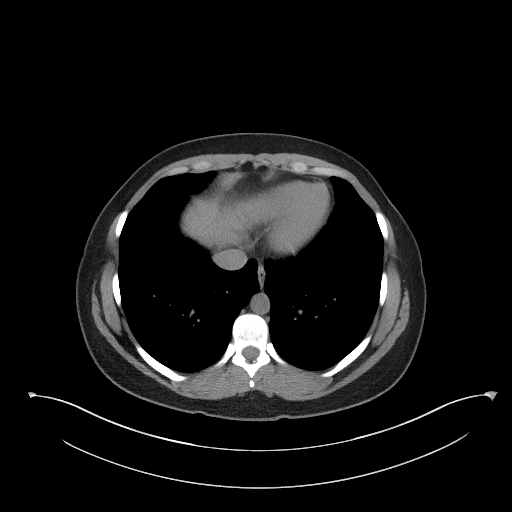

[Series 5: coronal st · coronal · 0.81mm/px · 3 of 90 slices shown]
[im 30/90  soft-tissue]
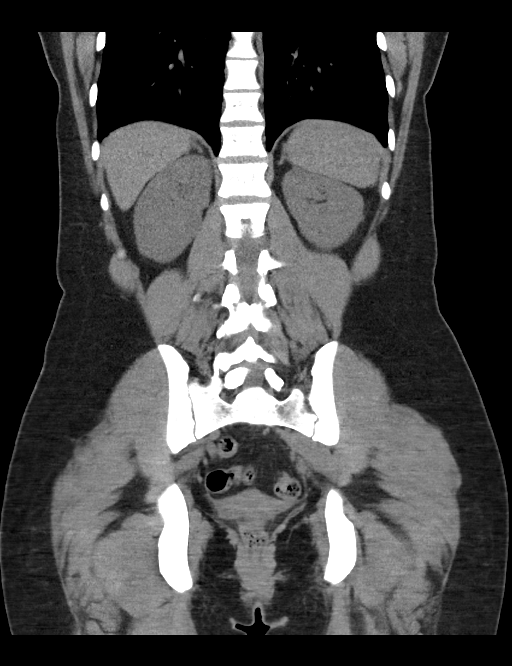
[im 40/90  soft-tissue]
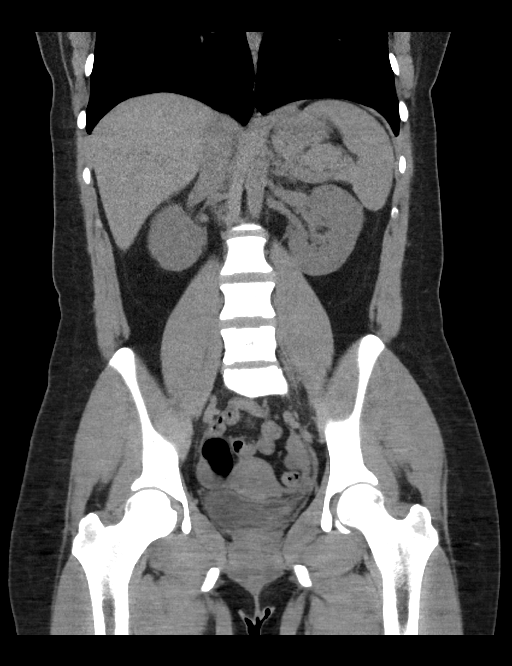
[im 50/90  soft-tissue]
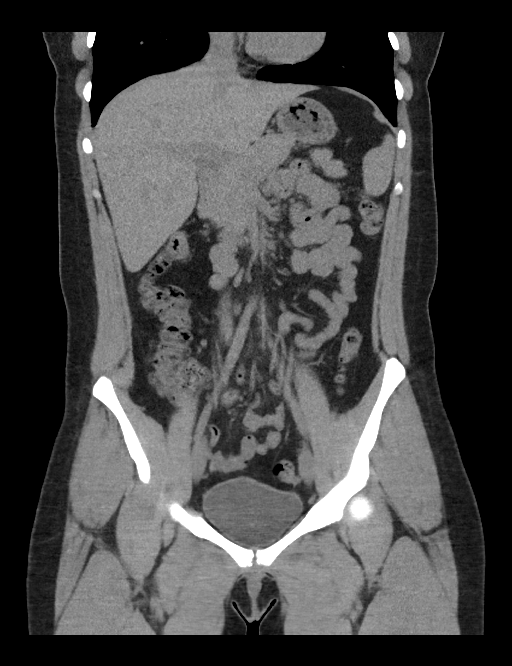

[16 of 46 positions shown; findings below may reference images not displayed]

FINDINGS: Lower chest: No acute abnormality.

Hepatobiliary: No focal liver abnormality is seen. No gallstones,
gallbladder wall thickening, or biliary dilatation.

Pancreas: Unremarkable. No pancreatic ductal dilatation or
surrounding inflammatory changes.

Spleen: Normal in size without focal abnormality.

Adrenals/Urinary Tract: Adrenal glands are unremarkable. Kidneys are
normal, without renal calculi, focal lesion, or hydronephrosis.
Bladder is unremarkable.

Stomach/Bowel: Stomach is within normal limits. Appendix appears
normal. No evidence of bowel wall thickening, distention, or
inflammatory changes.

Vascular/Lymphatic: No significant vascular findings are present. No
enlarged abdominal or pelvic lymph nodes.

Reproductive: Uterus and bilateral adnexa are unremarkable.

Other: No abdominal wall hernia or abnormality. No abdominopelvic
ascites.

Musculoskeletal: No acute or significant osseous findings.
IMPRESSION: No abnormality seen in the abdomen or pelvis.
